# Patient Record
Sex: Male | Born: 1955 | Race: White | Hispanic: No | Smoking: Former smoker
Health system: Southern US, Community
[De-identification: ages and names within clinical notes are randomized; demographics above are authoritative.]

## PROBLEM LIST (undated history)

## (undated) DIAGNOSIS — N200 Calculus of kidney: Secondary | ICD-10-CM

## (undated) HISTORY — PX: NEPHROSTOMY TRACT DILATATION W/ LITHOTRIPSY: SHX2080

## (undated) HISTORY — DX: Calculus of kidney: N20.0

---

## 2006-07-22 ENCOUNTER — Ambulatory Visit (HOSPITAL_COMMUNITY): Admission: RE | Admit: 2006-07-22 | Discharge: 2006-07-22 | Payer: Self-pay | Admitting: Urology

## 2006-07-25 ENCOUNTER — Ambulatory Visit (HOSPITAL_COMMUNITY): Admission: RE | Admit: 2006-07-25 | Discharge: 2006-07-25 | Payer: Self-pay | Admitting: Urology

## 2008-09-22 ENCOUNTER — Inpatient Hospital Stay (HOSPITAL_COMMUNITY): Admission: EM | Admit: 2008-09-22 | Discharge: 2008-09-25 | Payer: Self-pay | Admitting: Emergency Medicine

## 2009-05-09 IMAGING — CR DG CHEST 2V
2 series · 2 of 2 positions shown · non-contrast
Comparison: None available

CLINICAL DATA: Pneumonia.  Cough, congestion, fever.

CHEST - 2 VIEW

[w chest pa *]
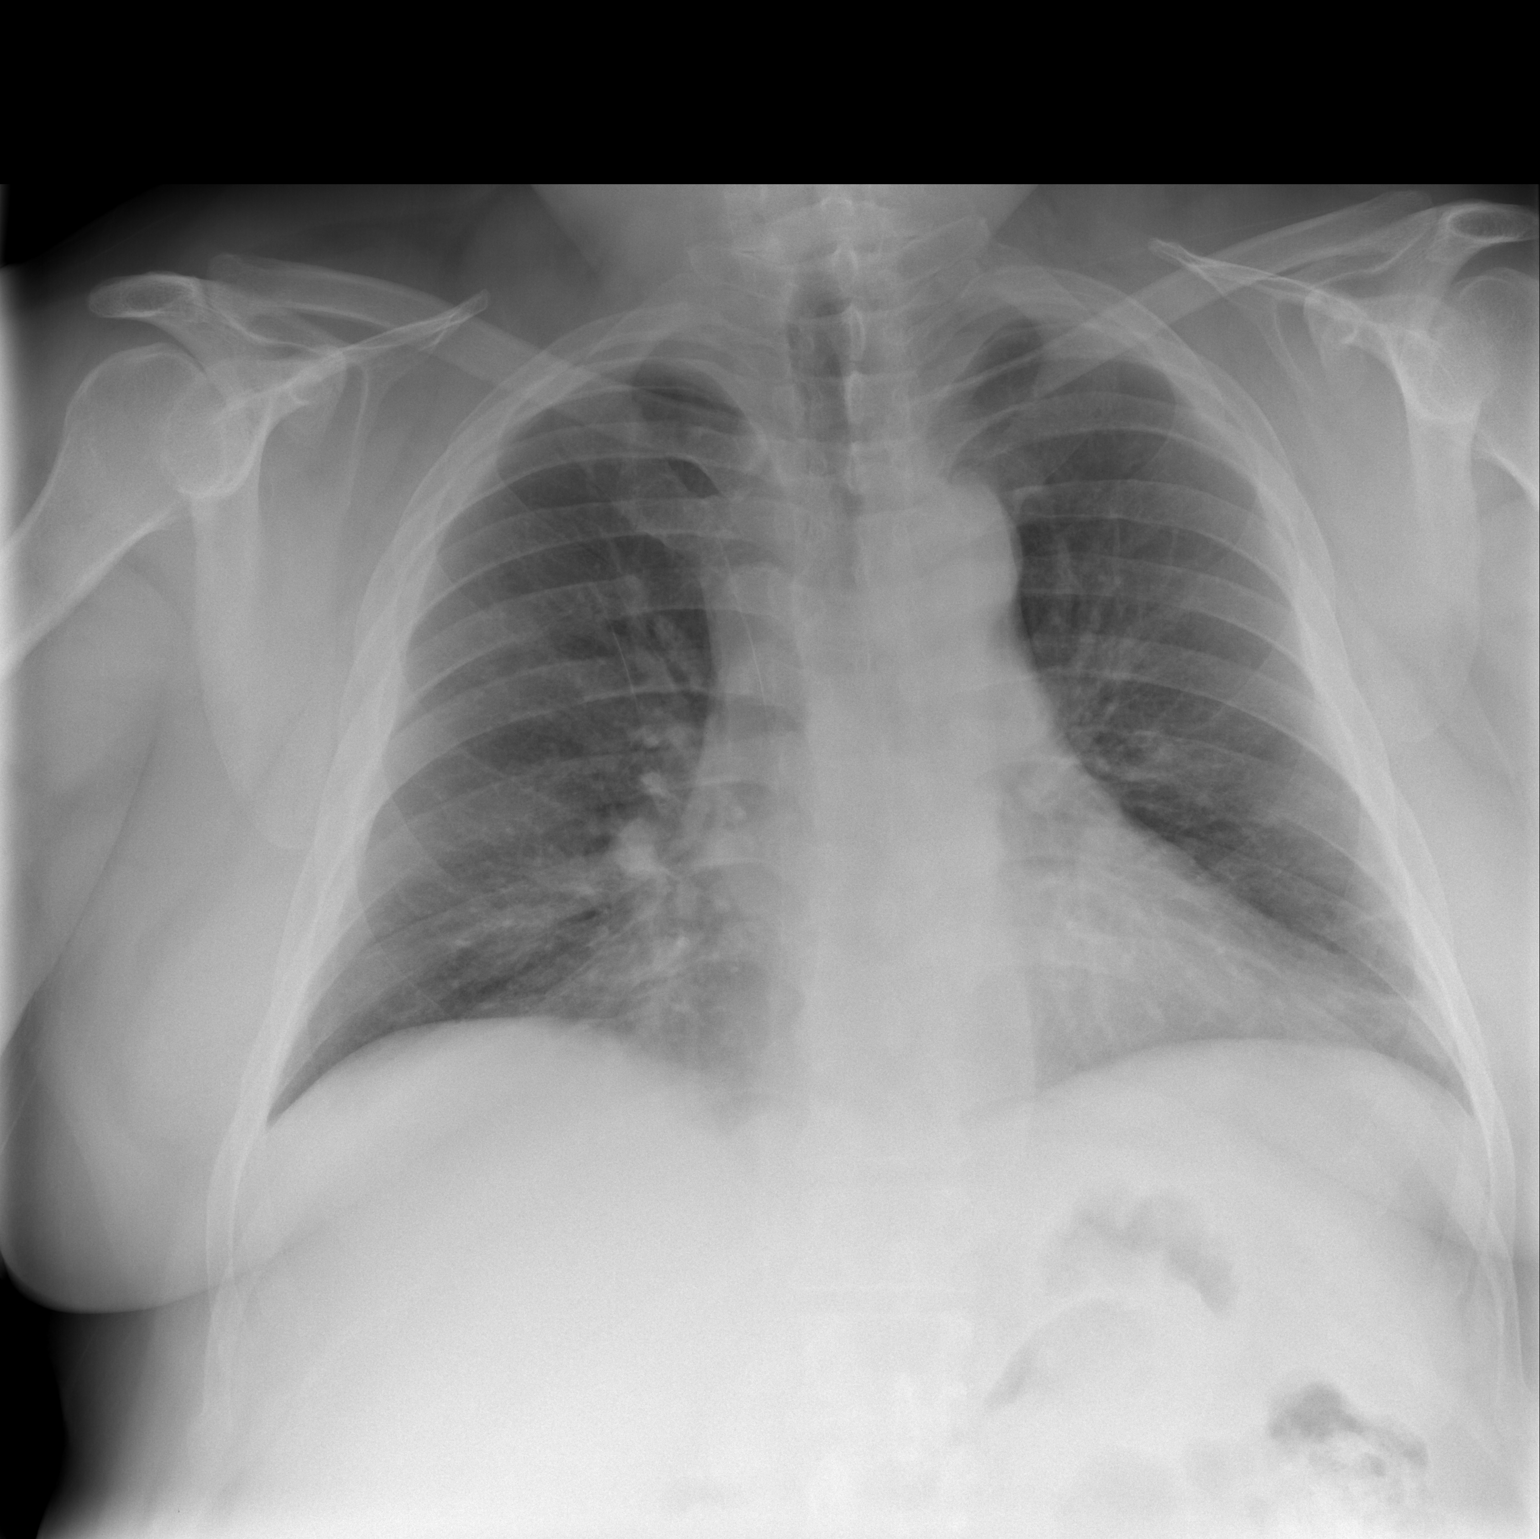

[w chest lat *]
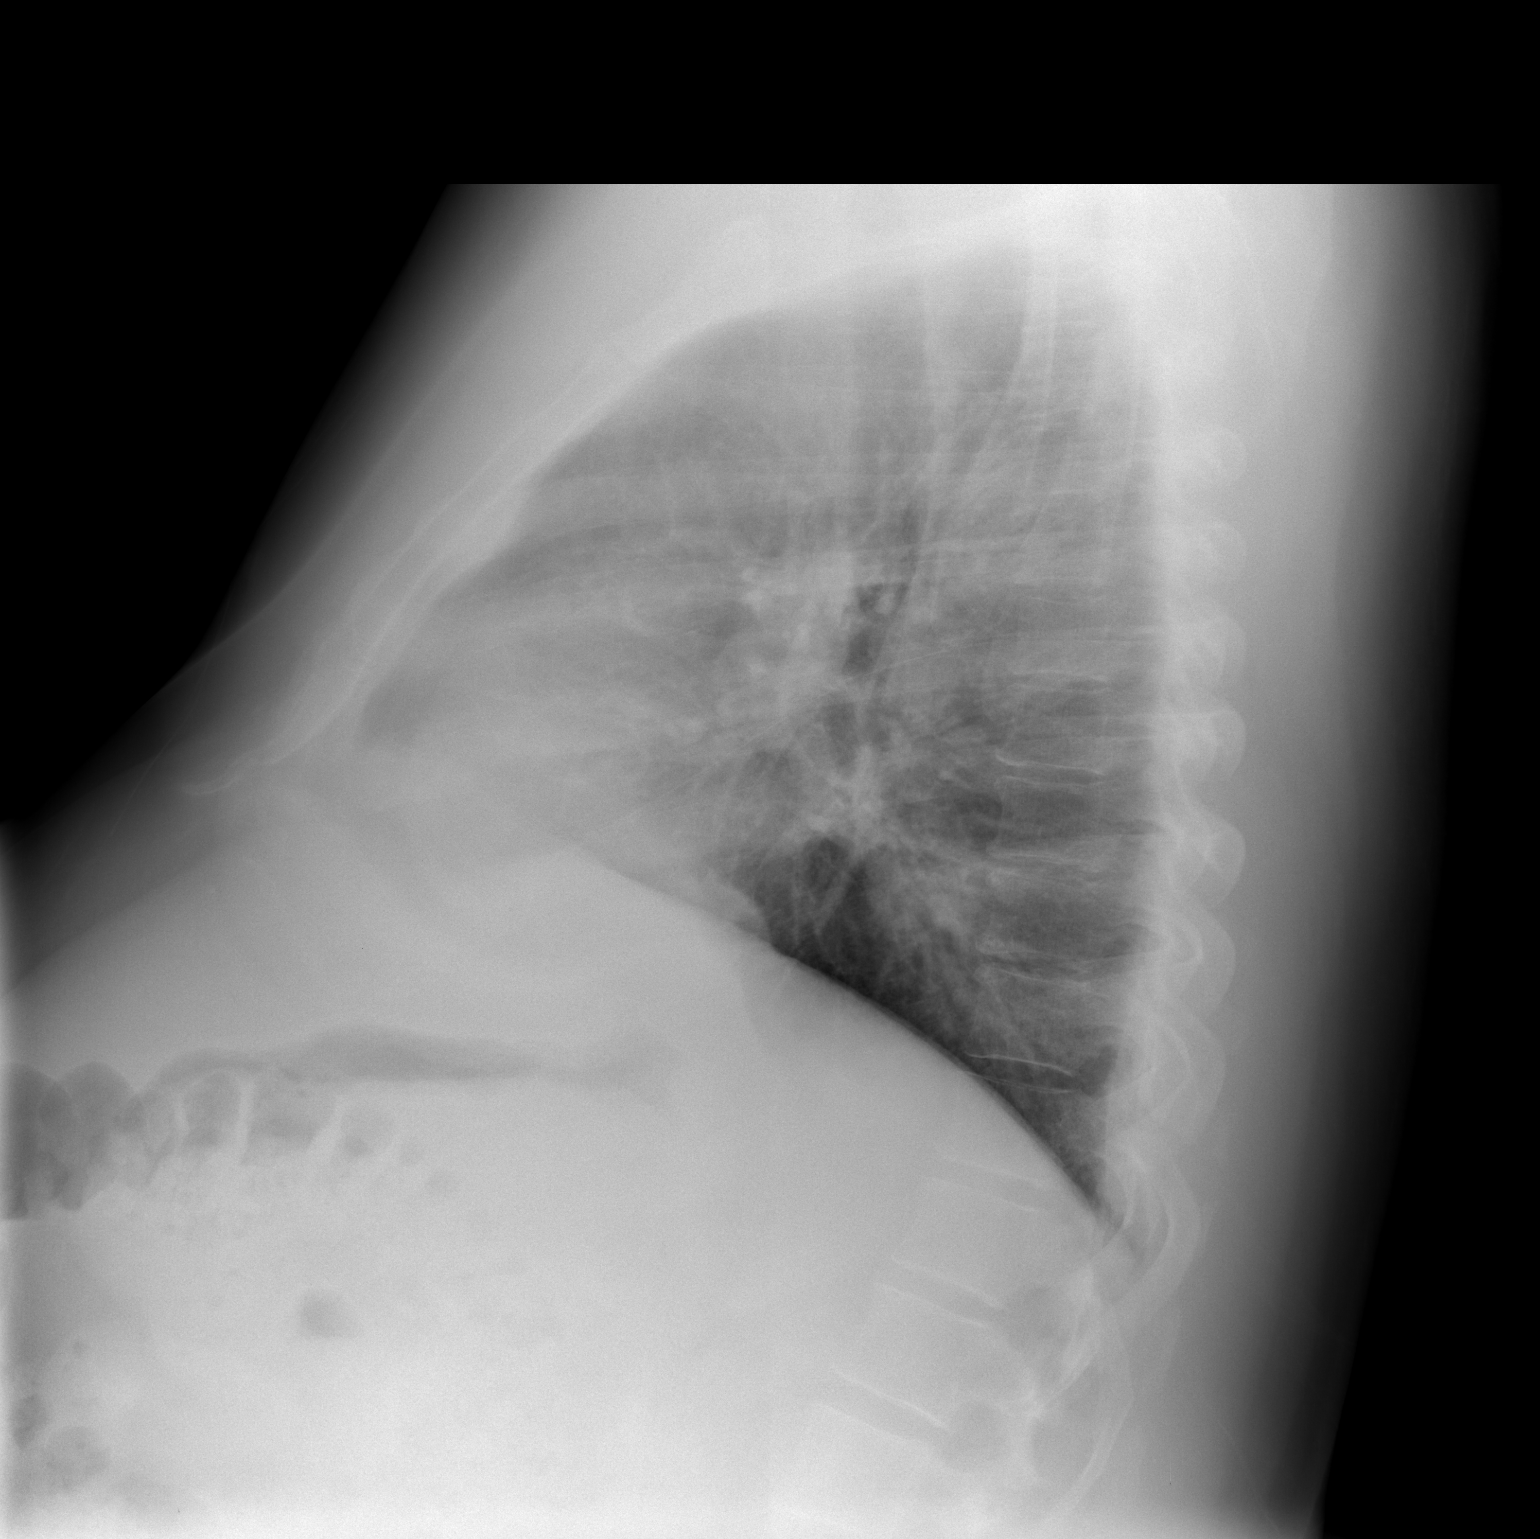

[2 of 2 positions shown; findings below may reference images not displayed]

FINDINGS: Airspace opacity is present at the medial right lung base
consistent with pneumonia.  On the lateral view, this localizes to
the right lower lobe.  No pleural effusion is identified.
Subsegmental atelectasis present at the left costophrenic angle.
Cardiopericardial silhouette upper limits of normal for projection.
Mediastinal contours within normal limits.
IMPRESSION: Right lower lobe pneumonia.

## 2009-05-10 IMAGING — CR DG ABDOMEN 1V
2 series · 2 of 2 positions shown · non-contrast
Comparison: KUB 07/25/06.

CLINICAL DATA: Abdominal pain.

ABDOMEN - 1 VIEW

[t abdomen supine *]
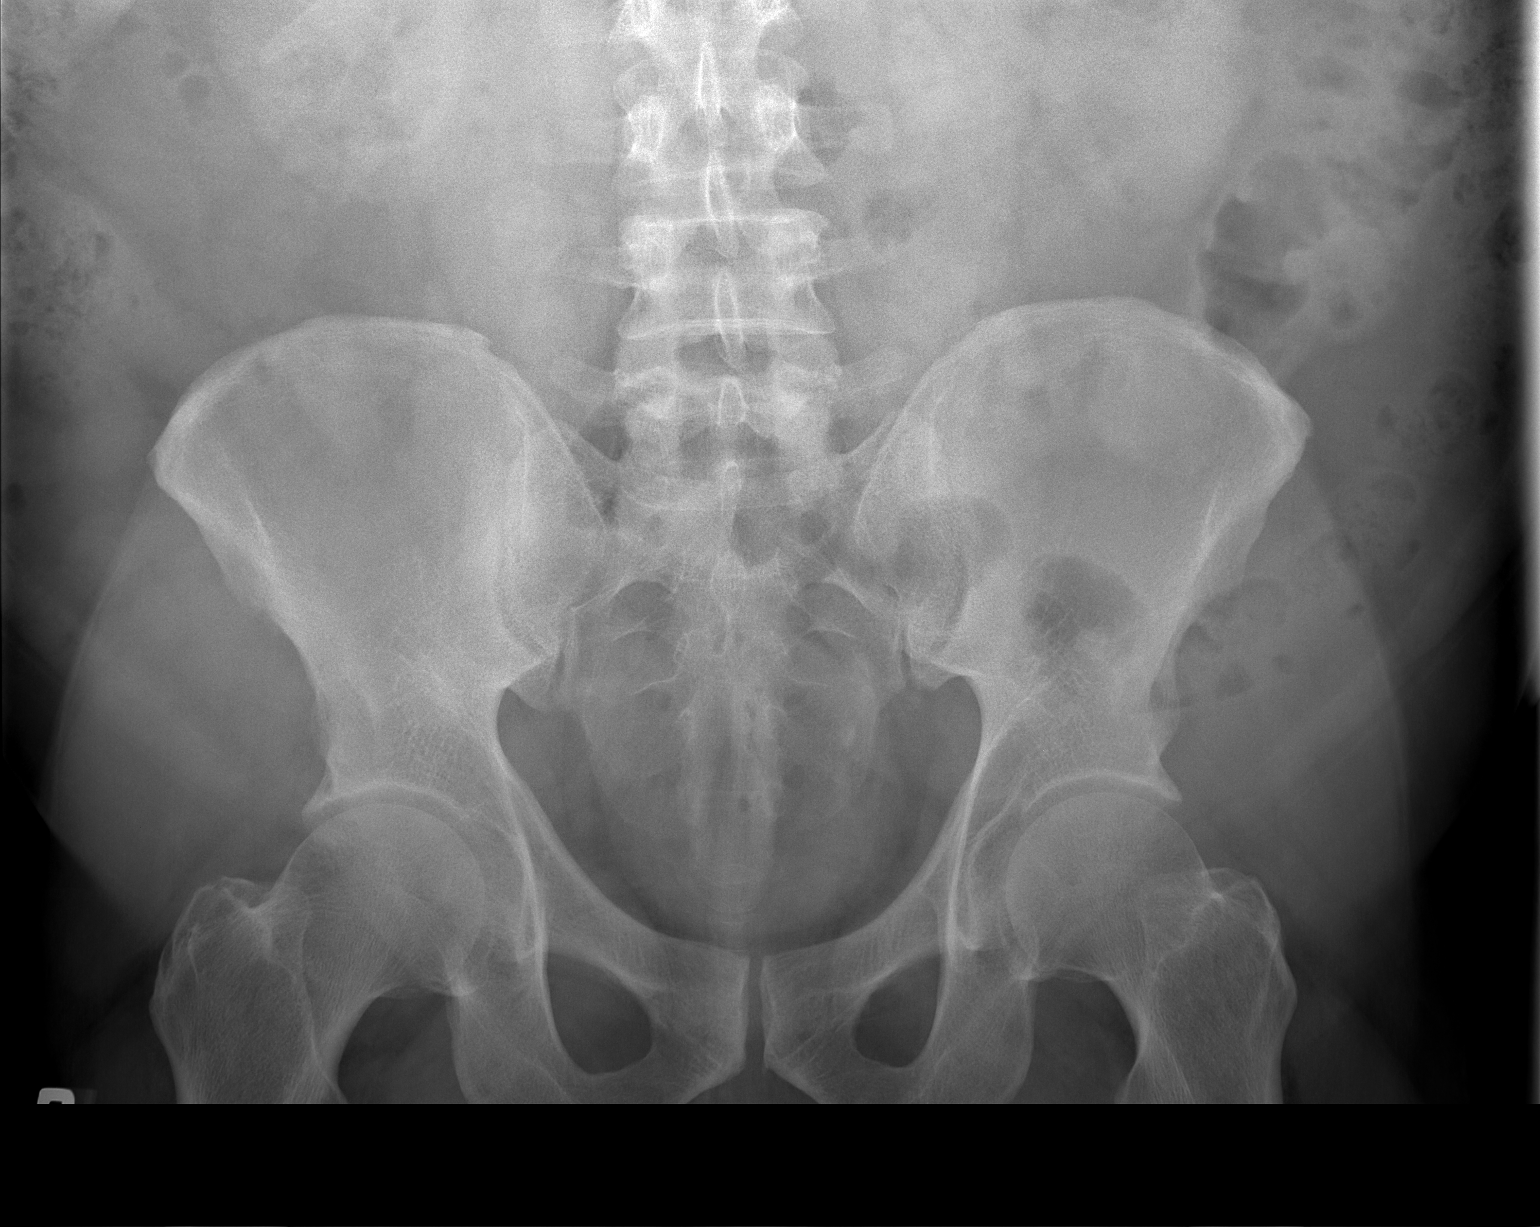

[t abdomen supine]
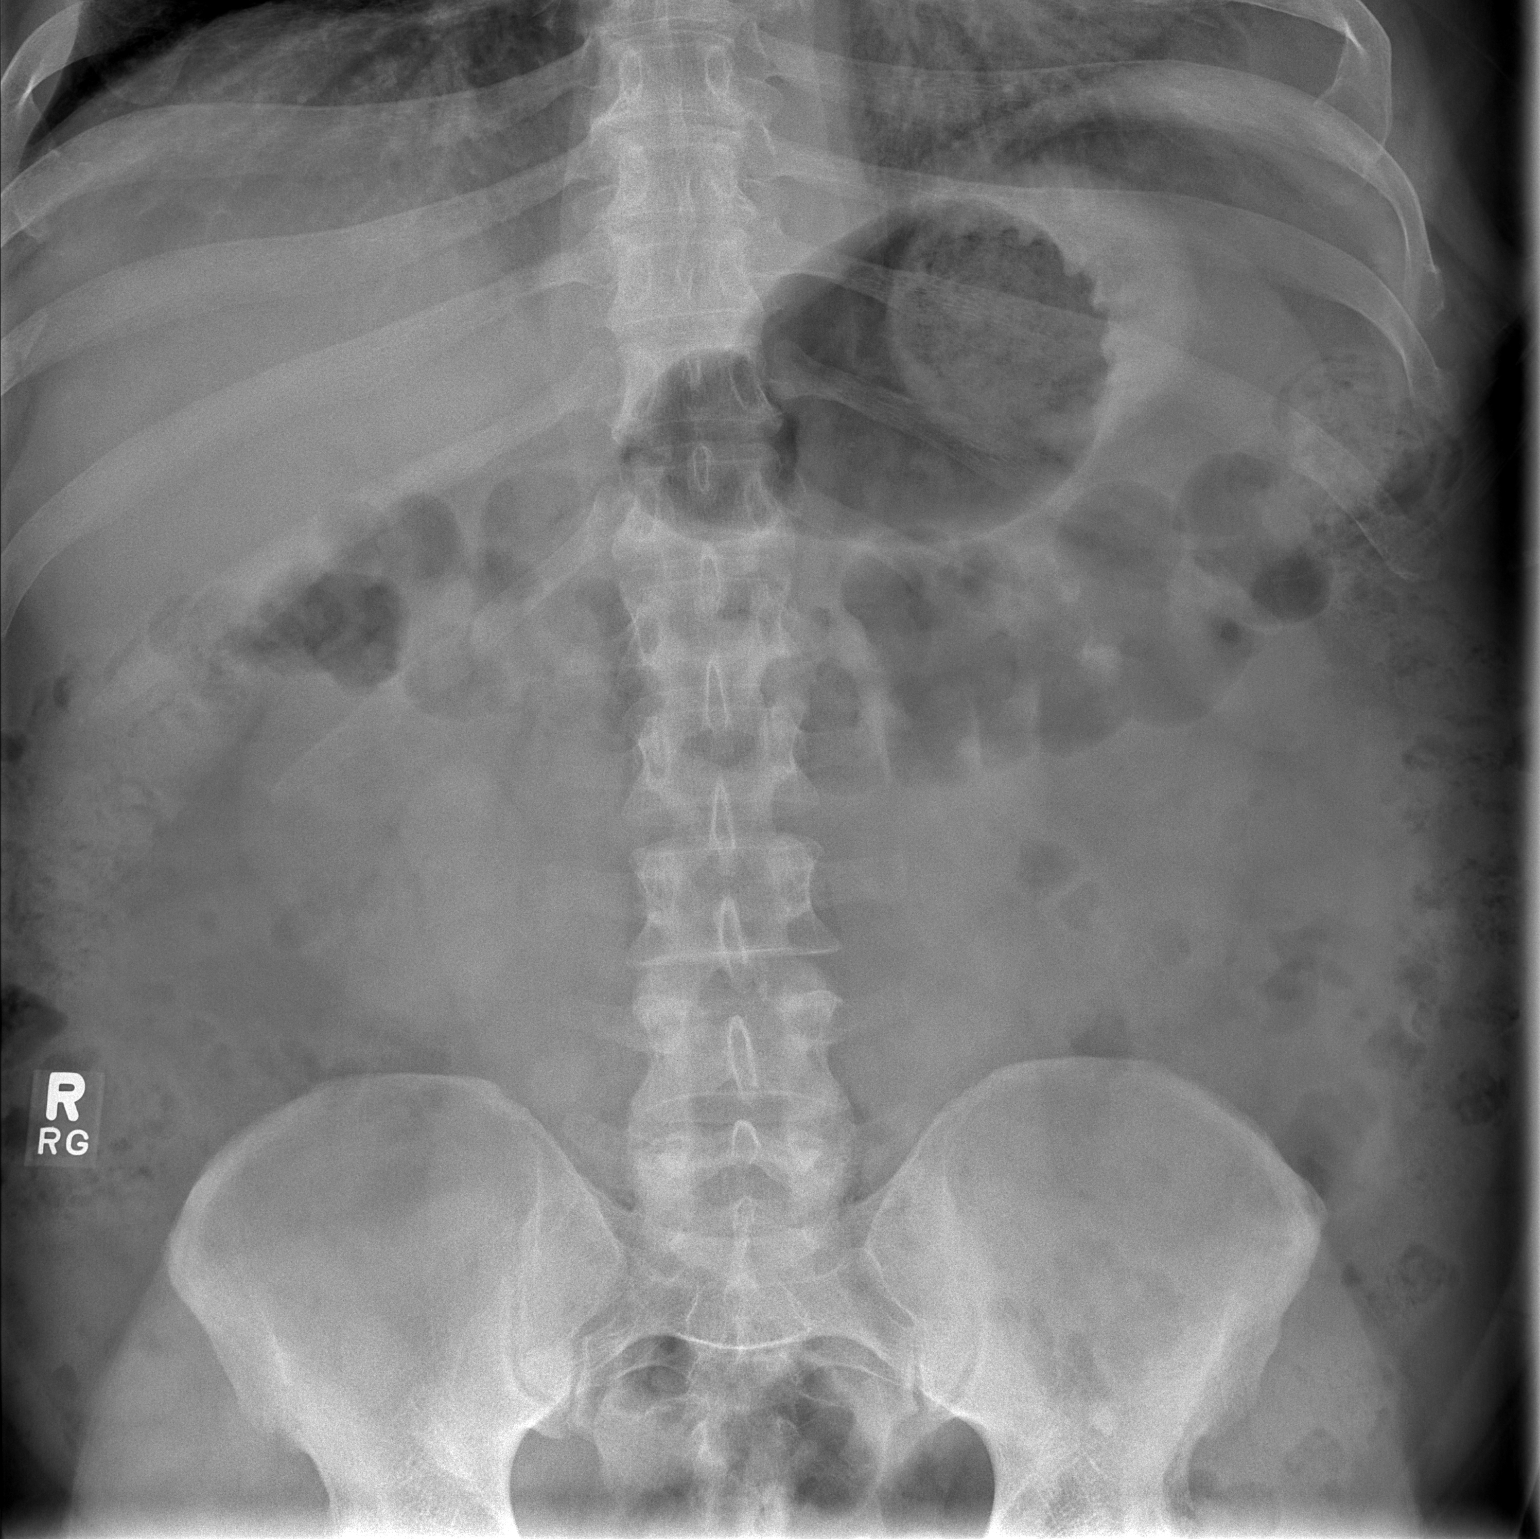

[2 of 2 positions shown; findings below may reference images not displayed]

FINDINGS: Moderate stool burden is noted.  No unexpected abdominal
calcifications.  No evidence of bowel obstruction.
IMPRESSION: Moderate stool burden.  No acute finding.

## 2011-02-13 NOTE — H&P (Signed)
Logan Jackson, Logan Jackson            ACCOUNT NO.:  [id number]   MEDICAL RECORD NO.:  [id number]          PATIENT TYPE:  INP   LOCATION:  1517                         FACILITY:  [facility]   PHYSICIAN:  Michiel Cowboy, MDDATE OF BIRTH:  Feb 18, 1956   DATE OF ADMISSION:  09/21/2008  DATE OF DISCHARGE:                              HISTORY & PHYSICAL   PRIMARY CARE Gaylia Kassel:  Dr. Catha Gosselin.   CHIEF COMPLAINT:  Shortness of breath.   The patient is a 55 year old gentleman who presented with complaints of  shortness of breath to his primary care walk-in clinic and was noted to  have a pneumonia.  His O2 saturation was the low 90s on room air at  which point he was sent to ED for admission down here.  He is satting  91% on room air.  We started him on oxygen.  Chest x-ray did confirm a  right lower lobe pneumonia.  He is endorsing low-grade fevers.  No chest  pain, some shortness of breath, cough for past few days.  No myalgias.  He is unclear if he had any sick contacts because he works with people  all the time.  Otherwise review of systems unremarkable.  No myalgias,  no arthralgias, no melena, no bright red blood per rectum, no nausea, no  vomiting, no constipation.  The patient always has had increased  abdominal girth which has not changed.   PAST MEDICAL HISTORY:  Significant for possible history of cardiomegaly.  History of kidney stones in the past which he has taken potassium  citrate but has stopped by his urologist.   SOCIAL HISTORY:  The patient used to smoke but quit years ago.   ALLERGIES:  No known drug allergies.   MEDICATIONS:  Currently actually not taking any.   FAMILY HISTORY:  Significant for father of coronary artery disease, died  in his 71s.   VITALS:  Temperature 100.6.  Blood pressure 159/85.  Pulse 120.  Respirations 22.  Satting 91% on room air, 95% on 2 L.  The patient  appears to be in no acute distress laying down in bed.  HEAD:  Nontraumatic,  somewhat dry mucous membranes and decreased skin  turgor.  LUNGS:  There is distant breath sounds bilaterally with occasional  wheezes.  Decreased breath sounds on the right HEART:  Rapid but  regular.  No murmur appreciated.  ABDOMEN:  Obese, nontender, appears to be slightly distended.  The  patient denies constipation.  LOWER EXTREMITIES:  No clubbing, cyanosis or edema.  Strength 5 out of 5  in all 4 extremities.  Otherwise neurologically intact.   LABS:  White blood cell count 12.2, hemoglobin 15.4, platelets 243.  Chest x-ray showing right lower lobe pneumonia and atelectasis on the  left.  EKG showing ventricular rate of 112.  No sinus tachycardia.  Poor  baseline, makes it difficult to evaluate but does not appear to have any  evidence of ST depression or elevation.   ASSESSMENT/PLAN:  This is a 55 year old gentleman with hypoxia and  tachycardia with right middle lobe infiltrate suggestive of pneumonia  and elevated white  blood cell count.  1. Pneumonia, community-acquired.  We will cover with Avelox, place on      oxygen, give albuterol p.r.n. and guaifenesin and incentive      spirometer.  Given low-grade fever we will obtain blood cultures.  2. Tachycardia.  Likely related to pneumonia and low-grade fever.      Given that his chest x-ray showing mild pneumonia but he has very      significant hypoxia down to actually 88 currently on room air, we      will D-dimer.  If elevated will need a CT scan to rule out      pulmonary embolus as well as creatinine is within normal limits.      The review of the notes the patient actually did endorse chest pain      to his primary care Delmar Dondero, though now denies it.  We will get      one set of cardiac markers.  Given poor baseline on EKG, we will      repeat it in the morning.  Also the patient likely dehydrated.  We      will give IV fluids.  3. Likely dehydration.  We will give IV fluids, but given the patient      has  cardiomegaly in the past, we will be careful to avoid over      fluid load.  4. Abdominal distention.  Will obtain KUB.  This likely more secondary      to obesity.  5. Prophylaxis.  Lovenox and Protonix.      Michiel Cowboy, MD  Electronically Signed     AVD/MEDQ  D:  09/22/2008  T:  09/22/2008  Job:  [id number]   cc:   Caryn Bee L. Little, M.D.  Fax: [phone]

## 2011-02-13 NOTE — Discharge Summary (Signed)
Logan Jackson, Logan Jackson            ACCOUNT NO.:  [id number]   MEDICAL RECORD NO.:  [id number]          PATIENT TYPE:  INP   LOCATION:  1517                         FACILITY:  [facility]   PHYSICIAN:  Kela Millin, M.D.DATE OF BIRTH:  August 10, 1956   DATE OF ADMISSION:  09/21/2008  DATE OF DISCHARGE:  09/25/2008                               DISCHARGE SUMMARY   DISCHARGE DIAGNOSES:  1. Community acquired pneumonia, right lower lobe.  2. Tachycardia - secondary to above with fevers, and volume depletion      - resolved.  3. Next constipation - resolved.   PROCEDURES AND STUDIES:  1. Chest x-ray on December 22 - right lower lobe pneumonia.  2. Abdominal films - moderate stool burden.   BRIEF HISTORY:  The patient is a pleasant 55 year old obese white male  with the above listed medical problems who presented with complaints of  shortness of breath.  He saw his primary care physician initially and  was found to have a pneumonia.  His O2 sats on room air were in the low  90s and so he was sent to the ED.  A chest x-ray in the ED confirmed the  pneumonia and he was admitted for further evaluation and management.   Please see the full admission history and physical dictated on September 21, 2008 for the details of the admission physical exam as well as the  laboratory data.   HOSPITAL COURSE:  1. Right lower lobe pneumonia - upon admission blood cultures were      done and the patient was empirically started on IV antibiotics.      The blood cultures have not grown any bacteria.  The patient      improved on the treatment regimen and has remained afebrile.  Also.      he had a mild leukocytosis upon admission of 12.2 which has      resolved - last white cell count prior to discharge is 9.1.  He is      clinically improved and he will be discharged on oral antibiotics      to follow up with his care physician.  His ambulatory pulse ox was      87% - 89% on room air and so he meets  requirements for home O2.  He      will be discharged on nasal cannula oxygen until he follows up with      his primary care physician and his O2 sats rechecked.  2. Tachycardia - resolved, as discussed above was secondary to the      fevers he had with #1 and also as he was volume depleted on      admission.  3. ?Obstructive sleep apnea - in this obese patient who was noted      while in the hospital to have some apneic periods while asleep.  I      have recommended that he get a sleep study done outpatient and he      has agreed to follow up with his primary care physician for this.  4. Constipation - resolved.  He  was treated with laxatives and stool      softeners while in the hospital.   DISCHARGE MEDICATIONS:  1. Avelox 400 mg one p.o. daily for 8 more days.  2. Mucinex DM 2 p.o. b.i.d.  3. Xopenex MDI 2 puffs q. 4-6 hours p.r.n.  4. Nasal cannula oxygen 2 liters.   FOLLOW-UP CARE:  Dr. Catha Gosselin in 1 week, call for appointment.   DISCHARGE CONDITION:  Improved/stable.      Kela Millin, M.D.  Electronically Signed     ACV/MEDQ  D:  09/25/2008  T:  09/25/2008  Job:  [id number]   cc:   Caryn Bee L. Little, M.D.  Fax: [phone]

## 2011-02-16 NOTE — Op Note (Signed)
Logan Jackson, Logan Jackson            ACCOUNT NO.:  [id number]   MEDICAL RECORD NO.:  [id number]          PATIENT TYPE:  AMB   LOCATION:  DAY                          FACILITY:  [facility]   PHYSICIAN:  Valetta Fuller, M.D.  DATE OF BIRTH:  [birth year]   DATE OF PROCEDURE:  07/22/2006  DATE OF DISCHARGE:                                 OPERATIVE REPORT   PREOPERATIVE DIAGNOSIS:  Left renal stone.   POSTOPERATIVE DIAGNOSIS:  Left renal stone.   PROCEDURE.:  1. Cystoscopy.  2. Left retrograde pyelogram with interpretation.  3. Left ureteral stent placement.   ANESTHESIA:  General.   SURGEON:  Valetta Fuller, M.D.   ASSISTANT:  Terie Purser, MD   COMPLICATIONS:  None.   DRAINS:  5-French 24 cm Polaris loop stent.   DISPOSITION:  Stable to post anesthesia care unit.   INDICATIONS:  [name] is a 55 year old male recently found to have a  large approximately 1.2 cm left renal pelvis stone.  This has been  symptomatic for him.  He was counseled regarding treatment options and has  agreed to proceed with shock wave lithotripsy.  He was brought to the  operating room today for placement of stent prior to his procedure.  Full  benefits and risks were explained, consent obtained.   DESCRIPTION OF PROCEDURE:  The patient was brought to the operating room  properly identified.  Time-out was performed to confirm correct patient,  procedure and side.  He was administered general anesthesia, given  preoperative antibiotics, placed supine, replaced the dorsal lithotomy  position and prepped and draped sterile fashion.  Cystoscopy was initially  performed using a 12 and 70 degrees lens, 22-French sheath anterior and  posterior urethra were normal.  The examination of bladder revealed no  mucosal abnormalities or evidence of foreign body or stone in the bladder.  Both ureteral orifices were normal position effluxing clear urine.   We then performed left retrograde pyelography.  A 6-French  end-hole catheter  was placed with the aid of a wire into the left ureteral orifice.  Retrograde pyelogram was performed.  There was no evidence of fixed filling  defect in the ureter.  The renal pelvis did fill and there was a filling  defect noted in the renal pelvis consistent with the stone.  There was no  significant hydro or other abnormalities noted on the retrograde.   We then placed a sensor wire through the end-hole catheter to the level of  the renal pelvis.  The catheter was removed and a 5 French 24 cm stent was  placed using cystoscopic and fluoroscopic guidance into proper position.  The wire was removed.  Stent was in proper position.  The patient's bladder  was then drained.  The cystoscope was removed.  Urojet was placed in the  urethra.  The patient was awoken from anesthesia and transported recovery  room in stable condition.  There no complications.  Please note Dr. Isabel Caprice  was present participated in all aspects of this procedure as he is the  primary surgeon.     ______________________________  Terie Purser, MD  Valetta Fuller, M.D.  Electronically Signed    JH/MEDQ  D:  07/22/2006  T:  07/23/2006  Job:  [id number]

## 2011-07-06 LAB — CULTURE, BLOOD (ROUTINE X 2): Culture: NO GROWTH

## 2011-07-06 LAB — COMPREHENSIVE METABOLIC PANEL
ALT: 31 U/L (ref 0–53)
AST: 26 U/L (ref 0–37)
AST: 31 U/L (ref 0–37)
Albumin: 3.3 g/dL — ABNORMAL LOW (ref 3.5–5.2)
CO2: 25 mEq/L (ref 19–32)
CO2: 25 mEq/L (ref 19–32)
Calcium: 8.3 mg/dL — ABNORMAL LOW (ref 8.4–10.5)
Calcium: 8.9 mg/dL (ref 8.4–10.5)
Chloride: 99 mEq/L (ref 96–112)
Creatinine, Ser: 1.11 mg/dL (ref 0.4–1.5)
Creatinine, Ser: 1.17 mg/dL (ref 0.4–1.5)
GFR calc Af Amer: >60 mL/min (ref >60)
GFR calc Af Amer: >60 mL/min (ref >60)
GFR calc non Af Amer: >60 mL/min (ref >60)
GFR calc non Af Amer: >60 mL/min (ref >60)
Glucose, Bld: 187 mg/dL — ABNORMAL HIGH (ref 70–99)
Sodium: 134 mEq/L — ABNORMAL LOW (ref 135–145)
Total Protein: 6.5 g/dL (ref 6.0–8.3)

## 2011-07-06 LAB — URINALYSIS, ROUTINE W REFLEX MICROSCOPIC
Bilirubin Urine: NEGATIVE
Glucose, UA: NEGATIVE mg/dL
Specific Gravity, Urine: 1.014 (ref 1.005–1.030)
Urobilinogen, UA: 0.2 mg/dL (ref 0.0–1.0)
pH: 5.5 (ref 5.0–8.0)

## 2011-07-06 LAB — URINE CULTURE
Colony Count: NO GROWTH
Culture: NO GROWTH

## 2011-07-06 LAB — APTT: aPTT: 30 seconds (ref 24–37)

## 2011-07-06 LAB — CBC
Hemoglobin: 13.4 g/dL (ref 13.0–17.0)
MCHC: 33.4 g/dL (ref 30.0–36.0)
MCHC: 34.3 g/dL (ref 30.0–36.0)
MCV: 91.5 fL (ref 78.0–100.0)
Platelets: 243 10*3/uL (ref 150–400)
RBC: 4.27 MIL/uL (ref 4.22–5.81)
RBC: 4.46 MIL/uL (ref 4.22–5.81)
RBC: 5.04 MIL/uL (ref 4.22–5.81)
RDW: 12.9 % (ref 11.5–15.5)
WBC: 12.2 10*3/uL — ABNORMAL HIGH (ref 4.0–10.5)

## 2011-07-06 LAB — CK TOTAL AND CKMB (NOT AT ARMC)
CK, MB: 2.8 ng/mL (ref 0.3–4.0)
Relative Index: 1.1 (ref 0.0–2.5)
Total CK: 248 U/L — ABNORMAL HIGH (ref 7–232)

## 2011-07-06 LAB — DIFFERENTIAL
Eosinophils Absolute: 0.2 10*3/uL (ref 0.0–0.7)
Eosinophils Relative: 1 % (ref 0–5)
Lymphocytes Relative: 10 % — ABNORMAL LOW (ref 12–46)
Lymphs Abs: 1.3 10*3/uL (ref 0.7–4.0)
Monocytes Absolute: 0.6 10*3/uL (ref 0.1–1.0)

## 2011-07-06 LAB — BASIC METABOLIC PANEL
Calcium: 7.9 mg/dL — ABNORMAL LOW (ref 8.4–10.5)
Creatinine, Ser: 0.99 mg/dL (ref 0.4–1.5)
GFR calc Af Amer: >60 mL/min (ref >60)
GFR calc non Af Amer: >60 mL/min (ref >60)
Sodium: 137 mEq/L (ref 135–145)

## 2011-07-06 LAB — D-DIMER, QUANTITATIVE: D-Dimer, Quant: 0.46 ug/mL-FEU (ref 0.00–0.48)

## 2011-07-06 LAB — PROTEIN ELECTROPH W RFLX QUANT IMMUNOGLOBULINS
Albumin ELP: 49.7 % — ABNORMAL LOW (ref 55.8–66.1)
Total Protein ELP: 6.6 g/dL (ref 6.0–8.3)

## 2011-07-06 LAB — LIPID PANEL
Total CHOL/HDL Ratio: 7.3 RATIO
VLDL: 71 mg/dL — ABNORMAL HIGH (ref 0–40)

## 2011-07-06 LAB — B-NATRIURETIC PEPTIDE (CONVERTED LAB): Pro B Natriuretic peptide (BNP): <30 pg/mL (ref 0.0–100.0)

## 2011-07-06 LAB — URINE MICROSCOPIC-ADD ON

## 2011-07-06 LAB — TROPONIN I: Troponin I: 0.01 ng/mL (ref 0.00–0.06)

## 2011-07-06 LAB — HEPATITIS C ANTIBODY: HCV Ab: NEGATIVE

## 2011-07-06 LAB — PROTIME-INR
INR: 1.1 (ref 0.00–1.49)
Prothrombin Time: 14 seconds (ref 11.6–15.2)

## 2012-05-26 ENCOUNTER — Ambulatory Visit: Payer: Self-pay | Admitting: Family Medicine

## 2012-05-26 VITALS — BP 136/72 | HR 89 | Temp 99.2°F | Resp 16 | Ht 63.25 in | Wt 261.2 lb

## 2012-05-26 DIAGNOSIS — G510 Bell's palsy: Secondary | ICD-10-CM

## 2012-05-26 MED ORDER — PREDNISONE 20 MG PO TABS: ORAL_TABLET | ORAL | Status: AC

## 2012-05-26 MED ORDER — ACYCLOVIR 200 MG PO CAPS: 400.0000 mg | ORAL_CAPSULE | ORAL | Status: AC

## 2012-05-26 NOTE — Progress Notes (Signed)
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 He's not had any medicine other than over-the-counter aspirin. He's had no pain in his face or his ear. He's had no loss of hearing. He said no difficulty swallowing, or other right-sided weakness symptoms. He's had no complaints of dry eye or pain in the eye.  Objective: No acute distress Clearly has a right facial droop with loss of all facial muscle activity including lack of white phlegm in for head on the right. TMs normal I: Partial orbicularis oculi closure with no erythema of the conjunctiva  Assessment: Acute Bell's palsy  Plan: Prednisone 40 mg daily x5, Zovirax 400 every 4 four-week while awake I discussed Bells Palsy with patient and gave him patient education   Patient Education     None

## 2012-05-26 NOTE — Patient Instructions (Addendum)
Bell's Palsy  Bell's palsy is a condition in which the muscles on one side of the face cannot move (paralysis). This is because the nerves in the face are paralyzed. It is most often thought to be caused by a virus. The virus causes swelling of the nerve that controls movement on one side of the face. The nerve travels through a tight space surrounded by bone. When the nerve swells, it can be compressed by the bone. This results in damage to the protective covering around the nerve. This damage interferes with how the nerve communicates with the muscles of the face. As a result, it can cause weakness or paralysis of the facial muscles.   Injury (trauma), tumor, and surgery may cause Bell's palsy, but most of the time the cause is unknown. It is a relatively common condition. It starts suddenly (abrupt onset) with the paralysis usually ending within 2 days. Bell's palsy is not dangerous. But because the eye does not close properly, you may need care to keep the eye from getting dry. This can include splinting (to keep the eye shut) or moistening with artificial tears. Bell's palsy very seldom occurs on both sides of the face at the same time.  SYMPTOMS    Eyebrow sagging.   Drooping of the eyelid and corner of the mouth.   Inability to close one eye.   Loss of taste on the front of the tongue.   Sensitivity to loud noises.  TREATMENT   The treatment is usually non-surgical. If the patient is seen within the first 24 to 48 hours, a short course of steroids may be prescribed, in an attempt to shorten the length of the condition. Antiviral medicines may also be used with the steroids, but it is unclear if they are helpful.   You will need to protect your eye, if you cannot close it. The cornea (clear covering over your eye) will become dry and can be damaged. Artificial tears can be used to keep your eye moist. Glasses or an eye patch should be worn to protect your eye.  PROGNOSIS   Recovery is variable, ranging  from days to months. Although the problem usually goes away completely (about 80% of cases resolve), predicting the outcome is impossible. Most people improve within 3 weeks of when the symptoms began. Improvement may continue for 3 to 6 months. A small number of people have moderate to severe weakness that is permanent.   HOME CARE INSTRUCTIONS    If your caregiver prescribed medication to reduce swelling in the nerve, use as directed. Do not stop taking the medication unless directed by your caregiver.   Use moisturizing eye drops as needed to prevent drying of your eye, as directed by your caregiver.   Protect your eye, as directed by your caregiver.   Use facial massage and exercises, as directed by your caregiver.   Perform your normal activities, and get your normal rest.  SEEK IMMEDIATE MEDICAL CARE IF:    There is pain, redness or irritation in the eye.   You or your child has an oral temperature above 102 F (38.9 C), not controlled by medicine.  MAKE SURE YOU:    Understand these instructions.   Will watch your condition.   Will get help right away if you are not doing well or get worse.  Document Released: 09/17/2005 Document Revised: 09/06/2011 Document Reviewed: 09/26/2009  [employer] Patient Information 2012 [employer].

## 2013-01-10 ENCOUNTER — Ambulatory Visit (INDEPENDENT_AMBULATORY_CARE_PROVIDER_SITE_OTHER): Payer: No Typology Code available for payment source | Admitting: Physician Assistant

## 2013-01-10 VITALS — BP 112/75 | HR 92 | Temp 98.7°F | Resp 16 | Ht 64.0 in | Wt 262.0 lb

## 2013-01-10 DIAGNOSIS — M25562 Pain in left knee: Secondary | ICD-10-CM

## 2013-01-10 DIAGNOSIS — L03116 Cellulitis of left lower limb: Secondary | ICD-10-CM

## 2013-01-10 DIAGNOSIS — L03119 Cellulitis of unspecified part of limb: Secondary | ICD-10-CM

## 2013-01-10 DIAGNOSIS — M25569 Pain in unspecified knee: Secondary | ICD-10-CM

## 2013-01-10 LAB — POCT CBC
HCT, POC: 47.2 % (ref 43.5–53.7)
Lymph, poc: 3.2 (ref 0.6–3.4)
MCH, POC: 29.8 pg (ref 27–31.2)
MCHC: 32 g/dL (ref 31.8–35.4)
MPV: 10.8 fL (ref 0–99.8)
POC Granulocyte: 7.3 — AB (ref 2–6.9)
POC LYMPH PERCENT: 27.5 %L (ref 10–50)
POC MID %: 10.2 %M (ref 0–12)
RDW, POC: 13 %
WBC: 11.7 10*3/uL — AB (ref 4.6–10.2)

## 2013-01-10 MED ORDER — HYDROCODONE-ACETAMINOPHEN 5-325 MG PO TABS: 1.0000 | ORAL_TABLET | Freq: Four times a day (QID) | ORAL | Status: AC | PRN

## 2013-01-10 MED ORDER — SULFAMETHOXAZOLE-TRIMETHOPRIM 800-160 MG PO TABS: 1.0000 | ORAL_TABLET | Freq: Two times a day (BID) | ORAL | Status: AC

## 2013-01-10 NOTE — Progress Notes (Signed)
Patient ID: Logan Jackson MRN: [id number], DOB: [birth year], 57 y.o. Date of Encounter: 01/10/2013, 4:52 PM  Primary Physician: No PCP Per Patient  Chief Complaint: Left leg cellulitis   HPI: 57 y.o. male with history below presents with 3 day history of left inner thigh erythema, swelling, and pain. Afebrile. No chills. Hurts to lay in certain positions. No spontaneous drainage. Has not tried warm compresses. Had a boil along his face around 2007 or so, spontaneously ruptured.    Past Medical History  Diagnosis Date  . Kidney stones      Home Meds: Prior to Admission medications   Not on File    Allergies: No Known Allergies  History   Social History  . Marital Status: Divorced    Spouse Name: N/A    Number of Children: N/A  . Years of Education: N/A   Occupational History  . Not on file.   Social History Main Topics  . Smoking status: Former Smoker    Quit date: 10/01/1992  . Smokeless tobacco: Not on file  . Alcohol Use: Not on file  . Drug Use: Not on file  . Sexually Active: Not on file   Other Topics Concern  . Not on file   Social History Narrative  . No narrative on file     Review of Systems: Constitutional: negative for chills, fever, night sweats, weight changes, or fatigue  Cardiovascular: negative for chest pain or palpitations Respiratory: negative for hemoptysis, wheezing, shortness of breath, or cough Abdominal: negative for abdominal pain, nausea, vomiting, or diarrhea Dermatological: see above   Physical Exam: Blood pressure 112/75, pulse 92, temperature 98.7 F (37.1 C), resp. rate 16, height 5\' 4"  (1.626 m), weight 262 lb (118.842 kg), SpO2 94.00%., Body mass index is 44.95 kg/(m^2). General: Well developed, well nourished, in no acute distress. Head: Normocephalic, atraumatic, eyes without discharge, sclera non-icteric, nares are without discharge.  Neck: Supple. Full ROM.  Lungs: Clear bilaterally to auscultation without wheezes,  rales, or rhonchi. Breathing is unlabored. Heart: RRR with S1 S2. No murmurs, rubs, or gallops appreciated. Msk:  Strength and tone normal for age. Extremities/Skin: Warm and dry. No clubbing or cyanosis. No edema. Left inner thigh with 1 cm x 1 cm erythematous indurated lesion with central fluctuance and surrounding erythema. Lesion is TTP.  Neuro: Alert and oriented X 3. Moves all extremities spontaneously. Gait is normal. CNII-XII grossly in tact. Psych:  Responds to questions appropriately with a normal affect.   Labs: Results for orders placed in visit on 01/10/13  POCT CBC      Result Value Range   WBC 11.7 (*) 4.6 - 10.2 K/uL   Lymph, poc 3.2  0.6 - 3.4   POC LYMPH PERCENT 27.5  10 - 50 %L   MID (cbc) 1.2 (*) 0 - 0.9   POC MID % 10.2  0 - 12 %M   POC Granulocyte 7.3 (*) 2 - 6.9   Granulocyte percent 62.3  37 - 80 %G   RBC 5.06  4.69 - 6.13 M/uL   Hemoglobin 15.1  14.1 - 18.1 g/dL   HCT, POC 47.8  29.5 - 53.7 %   MCV 93.2  80 - 97 fL   MCH, POC 29.8  27 - 31.2 pg   MCHC 32.0  31.8 - 35.4 g/dL   RDW, POC 62.1     Platelet Count, POC 212  142 - 424 K/uL   MPV 10.8  0 - 99.8 fL  PROCEDURE NOTE: Verbal consent obtained. Risks and benefits of the procedure were explained to the patient. Patient made an informed decision to proceed with the procedure. Betadine prep per usual protocol. Local anesthesia obtained with 1% lidocaine with epi 2 cc.   1.5 cm incision made with 11 blade along lesion.  Culture taken. Moderate amount of purulence expressed. Lesion explored revealing no loculations. Irrigated with lidocaine.  Packed with 1/4 inch plain packing.  Dressed. Wound care instructions including precautions with patient. Patient tolerated the procedure well. Recheck in 48 hours.       ASSESSMENT AND PLAN:  57 y.o. male with left inner thigh abscess s/p I&D. -I&D per above -Bactrim DS 1 po bid #20 no RF -Norco 5/325 mg 1 po q 4-6 hours prn pain #30 no RF, SED -Warm  compresses -Erythema outlined -RTC 48 hours, sooner if worse   Signed, Eula Listen, PA-C 01/10/2013 4:52 PM

## 2013-01-12 ENCOUNTER — Ambulatory Visit (INDEPENDENT_AMBULATORY_CARE_PROVIDER_SITE_OTHER): Payer: No Typology Code available for payment source | Admitting: Physician Assistant

## 2013-01-12 VITALS — BP 132/82 | HR 89 | Temp 98.4°F | Resp 17 | Ht 66.5 in | Wt 259.0 lb

## 2013-01-12 DIAGNOSIS — L03319 Cellulitis of trunk, unspecified: Secondary | ICD-10-CM

## 2013-01-12 DIAGNOSIS — L02214 Cutaneous abscess of groin: Secondary | ICD-10-CM

## 2013-01-12 NOTE — Progress Notes (Signed)
Patient ID: Dontarious Schaum MRN: [id number], DOB: 1956-09-28 57 y.o. Date of Encounter: 01/12/2013, 7:35 PM  Chief Complaint: Wound care   See previous note  HPI: 57 y.o. y/o male presents for wound care s/p I&D on 01/10/13. Doing well No issues or complaints Afebrile/ no chills No nausea or vomiting Tolerating Bactrim. Pain improved. Daily dressing change Previous note reviewed  Past Medical History  Diagnosis Date  . Kidney stones      Home Meds: Prior to Admission medications   Medication Sig Start Date End Date Taking? Authorizing Provider  HYDROcodone-acetaminophen (NORCO/VICODIN) 5-325 MG per tablet Take 1 tablet by mouth every 6 (six) hours as needed for pain. 01/10/13  Yes Ryan M Dunn, PA-C  sulfamethoxazole-trimethoprim (BACTRIM DS,SEPTRA DS) 800-160 MG per tablet Take 1 tablet by mouth 2 (two) times daily. 01/10/13  Yes Ryan M Dunn, PA-C    Allergies: No Known Allergies  ROS: Constitutional: Afebrile, no chills Cardiovascular: negative for chest pain or palpitations Dermatological: Positive for wound. Negative for erythema, pain, or warmth GI: No nausea or vomiting   EXAM: Physical Exam: Blood pressure 132/82, pulse 89, temperature 98.4 F (36.9 C), temperature source Oral, resp. rate 17, height 5' 6.5" (1.689 m), weight 259 lb (117.482 kg), SpO2 99.00%., Body mass index is 41.18 kg/(m^2). General: Well developed, well nourished, in no acute distress. Nontoxic appearing. Head: Normocephalic, atraumatic, sclera non-icteric.  Neck: Supple. Lungs: Breathing is unlabored. Heart: Normal rate. Skin:  Warm and moist. Dressing and packing in place. No induration, erythema, or tenderness to palpation. Neuro: Alert and oriented X 3. Moves all extremities spontaneously. Normal gait.  Psych:  Responds to questions appropriately with a normal affect.       PROCEDURE: Dressing and packing removed. No purulence expressed Wound bed healthy Irrigated with 1% plain  lidocaine 5 cc. Repacked with 1/4 plain packing.  Dressing applied  LAB: Culture:  reincubated for better growth.   A/P: 57 y.o. y/o male with groin cellulitis/abscess as above s/p I&D on 01/10/13. Wound care per above Continue Bactrim.  Pain well controlled Daily dressing changes Recheck 48 hours  Signed, Rhoderick Moody, PA-C 01/12/2013 7:35 PM

## 2013-01-13 LAB — WOUND CULTURE: Gram Stain: NONE SEEN

## 2013-01-14 ENCOUNTER — Ambulatory Visit (INDEPENDENT_AMBULATORY_CARE_PROVIDER_SITE_OTHER): Payer: No Typology Code available for payment source | Admitting: Physician Assistant

## 2013-01-14 VITALS — BP 105/68 | HR 80 | Temp 98.5°F | Resp 18 | Wt 259.0 lb

## 2013-01-14 DIAGNOSIS — L03116 Cellulitis of left lower limb: Secondary | ICD-10-CM

## 2013-01-14 DIAGNOSIS — L02419 Cutaneous abscess of limb, unspecified: Secondary | ICD-10-CM

## 2013-01-14 DIAGNOSIS — L03119 Cellulitis of unspecified part of limb: Secondary | ICD-10-CM

## 2013-01-14 NOTE — Patient Instructions (Signed)
Continue the antibiotic and warm compresses.

## 2013-01-14 NOTE — Progress Notes (Signed)
  Subjective:    Patient ID: Logan Jackson, male    DOB: [birth year], 57 y.o.   MRN: [id number]  HPI This 57 y.o. male presents for evaluation of cellulitis/abscess of the LEFT upper thigh/groin s/p I&D on 4/12, and wound care 4/14.  He's tolerating Bactrim and hydrocodone without adverse effects.  Pain is significantly diminished.  No fever, chills.  Medications and allergies reviewed.  Last notes reviewed.   Review of Systems As above.    Objective:   Physical Exam BP 105/68  Pulse 80  Temp(Src) 98.5 F (36.9 C) (Oral)  Resp 18  Wt 259 lb (117.482 kg)  BMI 41.18 kg/m2  Results for orders placed in visit on 01/10/13  WOUND CULTURE      Result Value Range   GRAM STAIN No WBC Seen     GRAM STAIN No Squamous Epithelial Cells Seen     GRAM STAIN Abundant GRAM POSITIVE COCCI IN PAIRS     Organism ID, Bacteria Abundant GROUP B STREP (S.AGALACTIAE) ISOLATED     Dressing and packing removed.  No erythema.  Scant induration.  Minimally tender.  No additional purulence expressed.  Packed with a very small amount of 1/4 inch packing and dressed.     Assessment & Plan:  Cellulitis of leg, left Complete the Bactrim.  Elected not to change the antibiotic to PCN/Amox due to the significant improvement in the status.  If he develops worsening symptoms, would switch at that time.  Patient Instructions  Continue the antibiotic and warm compresses. RTC 48 hours for wound care.  Expect no packing will be needed at that time.    Fernande Bras, PA-C Physician Assistant-Certified [facility]

## 2013-01-16 ENCOUNTER — Ambulatory Visit (INDEPENDENT_AMBULATORY_CARE_PROVIDER_SITE_OTHER): Payer: No Typology Code available for payment source | Admitting: Physician Assistant

## 2013-01-16 VITALS — BP 140/72 | HR 87 | Temp 98.2°F | Resp 16 | Ht 64.0 in | Wt 259.0 lb

## 2013-01-16 DIAGNOSIS — L02219 Cutaneous abscess of trunk, unspecified: Secondary | ICD-10-CM

## 2013-01-16 DIAGNOSIS — L02214 Cutaneous abscess of groin: Secondary | ICD-10-CM

## 2013-01-16 NOTE — Progress Notes (Signed)
  Subjective:    Patient ID: Logan Jackson, male    DOB: Feb 07, 1956, 57 y.o.   MRN: [id number]  HPI   Logan Jackson is a very pleasant 57 yr old male here for recheck of abscess of left groin.  Previous notes reviewed.  Pt states he is doing very well.  No pain.  Tolerating the abx well.  Continues warm compresses.  No fever, chills, nausea, vomiting.     Review of Systems  Skin: Positive for wound.  All other systems reviewed and are negative.       Objective:   Physical Exam  Vitals reviewed. Constitutional: He is oriented to person, place, and time. He appears well-developed and well-nourished. No distress.  HENT:  Head: Normocephalic and atraumatic.  Eyes: Conjunctivae are normal. No scleral icterus.  Pulmonary/Chest: Effort normal.  Neurological: He is alert and oriented to person, place, and time.  Skin: Skin is warm and dry.     Psychiatric: He has a normal mood and affect.    Wound Care: Dressing removed.   Packing removed.  Wound appears to be healing well.  Unable to express any material from the wound.  No erythema, warmth, or induration.  No packing necessary.  Bandage applied.         Assessment & Plan:  Abscess, groin   Logan Jackson is a very pleasant 58 yr old male here for wound care following drainage of an abscess of the left groin.  Wound appears to be healing well.  No drainage, erythema, induration or warmth.  I do not think the area requires further packing.  Bandage applied.  Encouraged pt to finish full course of abx.  No need for further follow up unless concerns arise.

## 2019-12-17 ENCOUNTER — Ambulatory Visit: Payer: Self-pay | Attending: Internal Medicine

## 2019-12-17 DIAGNOSIS — Z23 Encounter for immunization: Secondary | ICD-10-CM

## 2019-12-17 NOTE — Progress Notes (Signed)
   [id number]  Name:  Logan Jackson.    MRN: [id number] DOB: 1955-10-19  12/17/2019  Mr. Logan Jackson was observed post Covid-19 immunization for 15 minutes without incident. He was provided with Vaccine Information Sheet and instruction to access the V-Safe system.   Mr. Logan Jackson was instructed to call 911 with any severe reactions post vaccine: Marland Kitchen Difficulty breathing  . Swelling of face and throat  . A fast heartbeat  . A bad rash all over body  . Dizziness and weakness   Immunizations Administered    Name Date Dose VIS Date Route   Pfizer COVID-19 Vaccine 12/17/2019  2:59 PM 0.3 mL 09/11/2019 Intramuscular   Manufacturer: [employer], [employer]   Lot: [id number]   NDC: [address]-4

## 2020-01-08 ENCOUNTER — Ambulatory Visit: Payer: Self-pay | Attending: Internal Medicine

## 2020-01-08 DIAGNOSIS — Z23 Encounter for immunization: Secondary | ICD-10-CM

## 2020-01-08 NOTE — Progress Notes (Signed)
   [id number]  Name:  Logan Jackson.    MRN: [id number] DOB: 1955-11-21  01/08/2020  [name] was observed post Covid-19 immunization for 15 minutes without incident. He was provided with Vaccine Information Sheet and instruction to access the V-Safe system.   [name] was instructed to call 911 with any severe reactions post vaccine: Marland Kitchen Difficulty breathing  . Swelling of face and throat  . A fast heartbeat  . A bad rash all over body  . Dizziness and weakness   Immunizations Administered    Name Date Dose VIS Date Route   Pfizer COVID-19 Vaccine 01/08/2020  3:01 PM 0.3 mL 09/11/2019 Intramuscular   Manufacturer: [employer], [employer]   Lot: [id number]   NDC: [address]-3

## 2020-01-11 ENCOUNTER — Ambulatory Visit: Payer: Self-pay

## 2020-11-17 ENCOUNTER — Other Ambulatory Visit: Payer: Self-pay

## 2020-11-17 ENCOUNTER — Ambulatory Visit: Payer: Self-pay | Attending: Internal Medicine

## 2020-11-17 DIAGNOSIS — Z23 Encounter for immunization: Secondary | ICD-10-CM

## 2020-11-17 NOTE — Progress Notes (Signed)
   [id number]  Name:  Logan Jackson.    MRN: [id number] DOB: 07/25/1956  11/17/2020  [name] was observed post Covid-19 immunization for 15 minutes without incident. He was provided with Vaccine Information Sheet and instruction to access the V-Safe system.   [name] was instructed to call 911 with any severe reactions post vaccine: Marland Kitchen Difficulty breathing  . Swelling of face and throat  . A fast heartbeat  . A bad rash all over body  . Dizziness and weakness   Immunizations Administered    Name Date Dose VIS Date Route   PFIZER Comrnaty(Gray TOP) Covid-19 Vaccine 11/17/2020  3:40 PM 0.3 mL 09/08/2020 Intramuscular   Manufacturer: [employer], [employer]   Lot: [id number]   NDC: [phone]
